# Patient Record
Sex: Female | Born: 2005 | Hispanic: No | Marital: Single | Smoking: Never smoker
Health system: Southern US, Community
[De-identification: ages and names within clinical notes are randomized; demographics above are authoritative.]

## PROBLEM LIST (undated history)

## (undated) DIAGNOSIS — K589 Irritable bowel syndrome without diarrhea: Secondary | ICD-10-CM

## (undated) DIAGNOSIS — M1990 Unspecified osteoarthritis, unspecified site: Secondary | ICD-10-CM

## (undated) DIAGNOSIS — F329 Major depressive disorder, single episode, unspecified: Secondary | ICD-10-CM

## (undated) DIAGNOSIS — K5090 Crohn's disease, unspecified, without complications: Secondary | ICD-10-CM

## (undated) DIAGNOSIS — F32A Depression, unspecified: Secondary | ICD-10-CM

## (undated) DIAGNOSIS — M069 Rheumatoid arthritis, unspecified: Secondary | ICD-10-CM

## (undated) DIAGNOSIS — H2000 Unspecified acute and subacute iridocyclitis: Secondary | ICD-10-CM

## (undated) HISTORY — PX: MYRINGOTOMY WITH TUBE PLACEMENT: SHX5663

## (undated) HISTORY — PX: WISDOM TOOTH EXTRACTION: SHX21

## (undated) HISTORY — PX: EXPLORATORY LAPAROTOMY: SUR591

## (undated) HISTORY — PX: TONSILLECTOMY: SUR1361

---
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
## 2024-01-02 NOTE — Telephone Encounter (Addendum)
 Reason for Contact: distended colon found incidentally during exploratory surgery  Assessment:  Cassie Morrow underwent exploratory surgery for endometriosis today.  [name] has endometriosis, but they were told that her colon was distended, even more distended than bladder, according to surgeon. [name] has had daily bowel movement with decent amount.  The consistency was loosely formed.  Due to endometriosis, she has had abdominal pain. GI-wise, she has been doing fine.  Request: Mom is asking your advice what this means and what to do.  Thanks. EJ

## 2024-01-02 NOTE — Interval H&P Note (Signed)
 History and Physical Update Attending Charleen BRAVO. [place], MD 01/02/2024  8:37 AM  I have reviewed the History and Physical (including allergies) and examined the patient. The following changes in the patient's condition were assessed: No change in patient condition.   History of Present Illness:  Cassie Morrow, 18yrs old, Female is here for   Procedures: LAPAROSCOPY, DIAGNOSTIC (N/A) EXCISION, ENDOMETRIOSIS, LAPAROSCOPIC (N/A).  Past Medical History[1] Past Surgical History[2]   Prior to Admission medications  Medication Sig Start Date End Date Taking? Authorizing Provider  acetaminophen  (TYLENOL ) 500 mg Oral Tablet Take 1 Tablet (500 mg total) by mouth every 4 hours as needed for Pain. 12/20/23   Haven, Charleen BRAVO., MD  bisacodyL  (DULCOLAX) 5 mg Oral Tablet, Delayed Release (E.C.) Take 1 Tablet (5 mg total) by mouth daily as needed for Constipation. 12/03/23   Sukka, Neelakanta Dinakar, MD  docusate sodium  (COLACE) 100 mg Oral Capsule Take 1 Capsule (100 mg total) by mouth in the morning and 1 Capsule (100 mg total) before bedtime. 12/03/23   Sukka, Neelakanta Dinakar, MD  elagolix [place]) 150 mg Oral Tablet Take by mouth in the morning. 12/20/23   Haven, Charleen BRAVO., MD  escitalopram  (LEXAPRO ) 20 mg Oral Tablet Take 1 Tablet (20 mg total) by mouth in the morning. 02/01/22   Reported, Patient  gabapentin (NEURONTIN) 300 mg Oral Capsule Take 1 Capsule (300 mg total) by mouth three times a day as needed for Pain. 12/20/23   Haven, Charleen BRAVO., MD  hydrOXYzine hcl (ATARAX) 25 mg Oral Tablet Take 1 Tablet (25 mg total) by mouth every 8 hours as needed. 04/08/21   Reported, Patient  ibuprofen  (ADVIL ,MOTRIN ) 800 mg Oral Tablet Take 1 Tablet (800 mg total) by mouth every 6 hours as needed for Pain. 12/20/23   Haven, Charleen BRAVO., MD  linaCLOtide  (Linzess ) 145 mcg Oral Capsule Take 1 Capsule (145 mcg total) by mouth in the morning. 08/02/22   Reported, Patient  melatonin 10 mg Oral Tablet Take 5 mg by mouth  every evening.    Reported, Patient  oxyCODONE  (ROXICODONE ) 10 mg Oral Tablet Take 1 Tablet (10 mg total) by mouth every 6 hours as needed. 12/03/23   Sukka, Neelakanta Dinakar, MD  pantoprazole  (PROTONIX ) 40 mg Oral Tablet, Delayed Release (E.C.) Take 40 mg (1 Tablet) by mouth in the morning. 10/02/23   Jones, Gilbert K, MD  rizatriptan (MAXALT) 10 mg Oral Tablet Take 1 Tablet (10 mg total) by mouth every 2 hours as needed. 07/17/21   Reported, Patient  scopolamine base (TRANSDERM-SCOP) 1 mg over 3 days Transdermal patch 3 day 1 mg by Transdermal route every 72 hours (3 days). 07/24/21   Reported, Patient  traZODone  (DESYREL ) 50 mg Oral Tablet Take 1.5 Tablets (75 mg total) by mouth at bedtime. 06/08/21   Reported, Patient  upadacitinib 30 mg Oral Tablet Sustained Release 24HR Take 30 mg by mouth in the morning. 07/25/23 07/24/24  Reported, Patient   Allergies[3]    Physical Exam:  No acute change from patient's baseline mental status noted.  No acute cardiopulmonary compromise noted.  Electronically signed by Charleen DRAFTS Haven, MD      [1] Past Medical History: Diagnosis Date   Acute anterior uveitis of both eyes    Anxiety    Arthritis    Crohn's colitis (CMS-HCC)    Depression    Ehlers-Danlos syndrome    Kidney calculi    Psoriasis   [2] Past Surgical History: Procedure Laterality Date   CHOLECYSTECTOMY, LAPAROSCOPIC N/A  10/09/2022   Performed by Athena Ozell SAUNDERS, MD at [facility] OR MAIN   SUR - COLONOSCOPY N/A    SUR - ENDOSCOPY N/A   [3] Allergies Allergen Reactions   Cefpodoxime Hives and Rash    Rash, Hives - can take Ceclor   Cipro [Ciprofloxacin Hcl] Hives   Ciprofloxacin Hives   Lactose Bleeding, Nausea and Vomiting, Rash and Unknown    Other Reaction(s): GI intolerance, GI Intolerance  Lactose intolerant   Ibuprofen  Unknown    Per patient not allergic  Other (See Comments) Patient has crohn's disease    Lactase     Other Reaction(s): vomiting

## 2024-01-02 NOTE — Progress Notes (Signed)
 ------------------------------------------------------------------------------- Attestation signed by Elyn Charleen BRAVO., MD at 01/02/24 1114 Attending Attestation    I saw and evaluated the patient on the day of service and agree with the medical student's documentation.   Charleen Elyn, M.D. Clinical Assistant Professor  Department of Obstetrics and Gynecology [facility]  -------------------------------------------------------------------------------  GYN Pre-Operative Note  MEDICAL STUDENT NOTE FOR LEARNING PURPOSES ONLY   Preop DX: Endometriosis  Procedure:  Diagnostic Laparoscopy with possible excision of endometriosis, hysteroscopy, cystoscopy  Feeling well today, no complaints. Denies N/V/D, fever, chills, SOB, CP.   Pertinent PMHhX/PSHx:  OB History  Gravida Para Term Preterm AB Living  0 0 0 0 0 0  SAB IAB Ectopic Molar Multiple Live Births  0 0 0 0 0 0    Past Medical History[1] Past Surgical History[2] Allergies[3]  Medications Ordered Prior to Encounter[4]  PE  There were no vitals taken for this visit. Cardio: RRR, no m/r/g  Resp: CTAB, no wheezes, rales, or rhonchi Extremities: warm, well-perfused. DP Pulses 2+ Abdominal: soft, NT, ND. +BS.  Labs/Studies   Last CBC results 07/18/2023: WBC - External 8.9 10*9/L; MCV - External 85.6 fL 10/02/2023: WBC, Urine 6 /HPF (H) 11/29/2023: WBC (White Blood Cell Count) 7.69 k/uL; Hemoglobin 11.1 g/dL (L); Hemoglobin, Urine Negative; Neutrophils (%) 59 %; MCV (Mean Corpuscular Volume) 95.3 fL; Platelet Count 348 k/uL 12/01/2023: Hemoglobin A1C 4.5 %  Last CBC results 07/18/2023: WBC - External 8.9 10*9/L; MCV - External 85.6 fL 10/02/2023: WBC, Urine 6 /HPF (H) 11/29/2023: WBC (White Blood Cell Count) 7.69 k/uL; Hemoglobin 11.1 g/dL (L); Hemoglobin, Urine Negative; Neutrophils (%) 59 %; MCV (Mean Corpuscular Volume) 95.3 fL; Platelet Count 348 k/uL 12/01/2023: Hemoglobin A1C 4.5 %  Recent Results (from the  past 4320 hours)  EKG 12 LEAD   Collection Time: 10/01/23 11:45 PM  Test Value Low-High   EKG Text      INTERFACED DOCUMENTS - [facility] - OTHERWISE NORMAL ECG - Sinus tachycardia rate>99 No previous ECG available for comparison Reading Physician     [id number]&Marcu&Constantin    Heart Rate 102    P-R Interval 162    P Axis 70    QRS Duration 80    QT Internal 329    QTcB 429    QRS Axis 26    I-40 Axis 46    T-40 Axis 9    T Wave Axis 29    ST Axis 62   EKGSCAN   Collection Time: 10/02/23  8:18 AM   Narrative   Ordered by an unspecified provider.    Consents for procedure and blood are signed and on the chart.   Cassie Morrow          [1] Past Medical History: Diagnosis Date   Acute anterior uveitis of both eyes    Anxiety    Arthritis    Crohn's colitis (CMS-HCC)    Depression    Ehlers-Danlos syndrome    Kidney calculi    Psoriasis   [2] Past Surgical History: Procedure Laterality Date   CHOLECYSTECTOMY, LAPAROSCOPIC N/A 10/09/2022   Performed by Athena Ozell SAUNDERS, MD at [facility] OR MAIN   SUR - COLONOSCOPY N/A    SUR - ENDOSCOPY N/A   [3] Allergies Allergen Reactions   Cefpodoxime Hives and Rash    Rash, Hives - can take Ceclor   Cipro [Ciprofloxacin Hcl] Hives   Ciprofloxacin Hives   Lactose Bleeding, Nausea and Vomiting, Rash and Unknown    Other Reaction(s):  GI intolerance, GI Intolerance  Lactose intolerant   Ibuprofen  Unknown    Per patient not allergic  Other (See Comments) Patient has crohn's disease    Lactase     Other Reaction(s): vomiting  [4] No current facility-administered medications on file prior to encounter.   Current Outpatient Medications on File Prior to Encounter  Medication Sig Dispense Refill   bisacodyL  (DULCOLAX) 5 mg Oral Tablet, Delayed Release (E.C.) Take 1 Tablet (5 mg total) by mouth daily as needed for Constipation. 30 Tablet 3   docusate sodium  (COLACE) 100 mg Oral Capsule Take 1  Capsule (100 mg total) by mouth in the morning and 1 Capsule (100 mg total) before bedtime. 60 Capsule 3   escitalopram  (LEXAPRO ) 20 mg Oral Tablet Take 1 Tablet (20 mg total) by mouth in the morning.     hydrOXYzine hcl (ATARAX) 25 mg Oral Tablet Take 1 Tablet (25 mg total) by mouth every 8 hours as needed.     linaCLOtide  (Linzess ) 145 mcg Oral Capsule Take 1 Capsule (145 mcg total) by mouth in the morning.     melatonin 10 mg Oral Tablet Take 5 mg by mouth every evening.     oxyCODONE  (ROXICODONE ) 10 mg Oral Tablet Take 1 Tablet (10 mg total) by mouth every 6 hours as needed. 20 Tablet 0   pantoprazole  (PROTONIX ) 40 mg Oral Tablet, Delayed Release (E.C.) Take 40 mg (1 Tablet) by mouth in the morning. 30 Tablet 1   rizatriptan (MAXALT) 10 mg Oral Tablet Take 1 Tablet (10 mg total) by mouth every 2 hours as needed.     scopolamine base (TRANSDERM-SCOP) 1 mg over 3 days Transdermal patch 3 day 1 mg by Transdermal route every 72 hours (3 days).     traZODone  (DESYREL ) 50 mg Oral Tablet Take 1.5 Tablets (75 mg total) by mouth at bedtime.     upadacitinib 30 mg Oral Tablet Sustained Release 24HR Take 30 mg by mouth in the morning.

## 2024-01-02 NOTE — Care Plan (Signed)
 Pt admited to N108 in NAD.  Up in chair in room, no adverse effects.  Tolerating soda and crackers PO. States pain 0/10, no PO PRN pain medication given.  Updated on POC and d/c criteria. Family called back to room.  Will continue to monitor.

## 2024-01-02 NOTE — Procedures (Addendum)
 I was present for this procedure and agree with the documentation by resident Dr. Lavanda Conger.   Charleen Shadow, M.D. Clinical Assistant Professor  Department of Obstetrics and Gynecology [facility]     Name:  Cassie Morrow DOB:  2005-10-17 MRN:  [id number] HAR:  [id number]    [facility] OPERATIVE NOTE   Date of Procedure: 01/02/2024  Indications for Procedure:  21yrs G0P0000 female with chronic pelvic pain and suspected endometriosis presenting for diagnostic laparoscopy, cystoscopy, and hysteroscopy   Pre Op Dx:   Chronic abdominal pain [R10.9, G89.29] Dysmenorrhea [N94.6] Post Op Dx:  Chronic abdominal pain [R10.9, G89.29] Dysmenorrhea [N94.6]  Procedure with Wound Class:   Procedures: LAPAROSCOPY, DIAGNOSTIC - Wound Class: Clean EXCISION, ENDOMETRIOSIS, LAPAROSCOPIC - Wound Class: Clean CYSTOSCOPY - Wound Class: Clean Contaminated HYSTEROSCOPY, DIAGNOSTIC - Wound Class: Clean Contaminated   Surgeon:   Primary: Haven, Charleen BRAVO., MD Resident - Assisting: Conger Lavanda, DO  OR Staff / Assistants:   Circulating Nurse: Rona Just, RN Relief Circulating Nurse: Mccoy Grout, RN Scrub: Lorriane Channel Relief Scrub: Prentiss Moats  Anesthesia:  General  Estimated Blood Loss: No values recorded during case  UOP: 200 mL IVF: 1000 mL  Specimen removed:    ID Type Source Tests Collected by Time Destination  1 : Peritoneal Biopsy - Endometriosis Tissue Peritoneal SURGICAL PATHOLOGIC EXAMINATION Seward, Charleen BRAVO., MD 01/02/2024 [phone]    Device/s Implanted:  * No implants in log *  Findings: Normal uterus, mildly polycystic appearing bilateral ovaries. Gunpowder endometriosis lesions visualized anterior to uterus, on right lateral sidewall, and in posterior cul-de-sac. Dilated descending colon with adhesions attaching colon to left pelvic side wall. Normal anatomy of bladder on cystoscopy with visualized bilateral ureters. Overall normal premenopausal  uterine cavity with one small polyp near right tubal ostia.  Complications:  None  Description of procedure: After informed consent the patient was taken to the operating room and placed in dorsal supine position where general endotracheal anesthesia was administered and found to be adequate. She was placed in dorsal lithotomy position with her arms tucked.  She was prepped and draped in the usual sterile fashion.  A Foley catheter placed.  A timeout was called and the procedure confirmed.  A speculum was placed allowing visualization of the cervix.  A Hulka tenaculum was placed.  The laparoscopic equipment was prepared.  The abdomen was entered through previous intraumbilical incision using a 5mm Excel trocar under direct visualization and a pneumoperitoneum was established.  Right and left lower quadrant ports were placed under visualization.  The entry point was visualized from an inferior port and atraumatic entry confirmed. The pelvis was visualized by freeing adhesions of the small and large intestine from the pelvic sidewalls. Ovaries were examined, no endometriomas visualized. Gunpowder lesion visualized and removed on anterior pelvic wall. Additional lesions were visualized and ablated in posterior cul-de-sac and right pelvic sidewall. Cystoscopy was then performed with normal anatomy.   Foley catheter was removed and cystoscopy was performed with normal findings noted above.  A speculum was placed and Hulka tenaculum was removed. The uterus was sounded to 7cm.  The hysteroscope was advanced and proliferative endometrium was noted with visualization of tubal ostia. The procedure was concluded. She was returned to dorsal supine position, awakened and extubated having appeared to tolerate the procedure well.  All counts were correct.  She was transferred to PACU in good condition.  Dr. Shadow attending physician, was present for the procedure.  Condition of Patient at the time  of transfer to next level:   Stable Planned Disposition following Recovery:  Home  Lavanda Conger, DO Date:  01/02/2024 Time:  10:51 AM

## 2024-01-02 NOTE — Care Plan (Signed)
 Patient has met discharge criteria.  Discharge instructions given to patient and mother.  Iv d/c'd.

## 2024-01-08 ENCOUNTER — Emergency Department (HOSPITAL_BASED_OUTPATIENT_CLINIC_OR_DEPARTMENT_OTHER)
Admission: EM | Admit: 2024-01-08 | Discharge: 2024-01-09 | Disposition: A | Source: Home / Self Care | Attending: Emergency Medicine | Admitting: Emergency Medicine

## 2024-01-08 ENCOUNTER — Encounter (HOSPITAL_BASED_OUTPATIENT_CLINIC_OR_DEPARTMENT_OTHER): Payer: Self-pay | Admitting: Emergency Medicine

## 2024-01-08 DIAGNOSIS — G8918 Other acute postprocedural pain: Secondary | ICD-10-CM | POA: Diagnosis not present

## 2024-01-08 DIAGNOSIS — R091 Pleurisy: Secondary | ICD-10-CM

## 2024-01-08 DIAGNOSIS — R109 Unspecified abdominal pain: Secondary | ICD-10-CM | POA: Diagnosis present

## 2024-01-08 MED ORDER — ONDANSETRON HCL 4 MG/2ML IJ SOLN
4.0000 mg | Freq: Once | INTRAMUSCULAR | Status: AC
  Administered 2024-01-08: 4 mg via INTRAVENOUS
  Filled 2024-01-08: qty 2

## 2024-01-08 MED ORDER — FENTANYL CITRATE (PF) 50 MCG/ML IJ SOSY
50.0000 ug | PREFILLED_SYRINGE | Freq: Once | INTRAMUSCULAR | Status: AC
  Administered 2024-01-08: 50 ug via INTRAVENOUS
  Filled 2024-01-08: qty 1

## 2024-01-08 NOTE — ED Triage Notes (Signed)
 Pt reports pain at incision site, RUQ and lower back today; pain has increased throughout the day and is not controlled by rx or OTC meds; s/p exp lap 12/8

## 2024-01-08 NOTE — ED Provider Notes (Signed)
 [place] [facility] Provider Note   CSN: [id number] Arrival date & time: 01/08/24  2245     Patient presents with: Post-op Problem   Cassie Morrow is a 18 y.o. female.  {Add pertinent medical, surgical, social history, OB history to YEP:67052} The history is provided by the patient and a parent.  Patient with history of chronic abdominal pain, Crohn's disease, EDS, irritable bowel syndrome, chronic joint pain presents with postoperative abdominal pain Patient underwent diagnostic laparoscopy for endometriosis around December 8, performed at [facility].  Patient reports that initially had good pain relief, but over the past 24 hours she has had increasing generalized abdominal pain and nausea without vomiting.  Denies any diarrhea, reports normal bowel movements without any blood or melena.  No vaginal bleeding. She also reports 2 nights ago she had severe pleuritic chest pain and since that time has been feeling short of breath. No previous history of CAD/VTE.  Father reports they called her surgeon in [place] who advised ER evaluation Patient takes Rinvoq Prior to Admission medications  Medication Sig Start Date End Date Taking? Authorizing Provider  Alpha-D-Galactosidase (BEANO) TABS Take 1 tablet by mouth daily at 6 PM.    [provider]  calcium  carbonate (OS-CAL) 1250 (500 Ca) MG chewable tablet Chew 1 tablet by mouth daily.    [provider]  cholecalciferol  (VITAMIN D3) 25 MCG (1000 UNIT) tablet Take 1,000 Units by mouth daily.    [provider]  clotrimazole -betamethasone  (LOTRISONE ) cream APPLY A SMALL AMOUNT TO THE AFFECTED AREA(S) ONCE DAILY AS DIRECTED 06/07/22   Antonio Meth, Yvonne R, DO  docusate sodium  (COLACE) 100 MG capsule Take 100 mg by mouth every other day. Alternates with exlax    [provider]  escitalopram  (LEXAPRO ) 20 MG tablet TAKE 1 TABLET BY MOUTH DAILY MUST SCHEDULE AN  APPOINMENT FOR FURTHER REFILLS 09/27/23   Antonio Meth, Jamee SAUNDERS, DO  fluticasone (FLONASE) 50 MCG/ACT nasal spray Place 2 sprays into both nostrils daily. 04/30/16   [provider]  granisetron  (KYTRIL ) 1 MG tablet Take 1 mg by mouth 2 (two) times daily as needed for nausea.     [provider]  inFLIXimab  in sodium chloride  0.9 % Inject 100 mg into the vein once. Pt takes every 4 weeks and next dose is 02/22/19 02/22/19   [provider]  inFLIXimab -dyyb (INFLECTRA ) 100 MG SOLR Inject 100 mg into the vein every 6 (six) weeks. 06/07/22   Lowne Chase, Yvonne R, DO  Lactobacillus Rhamnosus, GG, (RA PROBIOTIC DIGESTIVE CARE) CAPS Take 1 tablet by mouth daily.    [provider]  linaclotide  (LINZESS ) 145 MCG CAPS capsule Take 145 mcg by mouth daily before breakfast.    [provider]  Melatonin 10 MG TABS Take by mouth at bedtime.     [provider]  Omega-3 1000 MG CAPS Take 1 capsule by mouth daily.    [provider]  pantoprazole  (PROTONIX ) 40 MG tablet Take 40 mg by mouth 2 (two) times daily.     [provider]  Probiotic Product (SUPER PROBIOTIC) CAPS Take 1 capsule by mouth daily. 11/08/16   [provider]  Sennosides 25 MG TABS Take 1 tablet by mouth daily.     [provider]  Simethicone  125 MG CAPS Take 1 capsule by mouth 3 (three) times daily.    [provider]  traZODone  (DESYREL ) 50 MG tablet Take 75 mg by mouth at  bedtime.     [provider]    Allergies: Cefpodoxime proxetil, Ciprofloxacin, Lactose intolerance (gi), and Motrin  [ibuprofen ]    Review of Systems  Constitutional:  Negative for fever.  Respiratory:  Positive for shortness of breath.   Cardiovascular:  Positive for chest pain.  Gastrointestinal:  Positive for abdominal pain and nausea. Negative for blood in stool, constipation, diarrhea and vomiting.  Genitourinary:  Negative for dysuria and vaginal bleeding.     Updated Vital Signs BP (!) 143/73   Pulse (!) 101   Temp 98.3 F (36.8 C) (Oral)   Ht 1.524 m (5')   Wt 86.2 kg   SpO2 100%   BMI 37.11 kg/m   Physical Exam CONSTITUTIONAL: Well developed/well nourished HEAD: Normocephalic/atraumatic EYES: EOMI/PERRL, no icterus ENMT: Mucous membranes moist NECK: supple no meningeal signs SPINE/BACK:entire spine nontender CV: S1/S2 noted, tachycardic LUNGS: Lungs are clear to auscultation bilaterally, no apparent distress ABDOMEN: soft, moderate diffuse tenderness, no rebound or guarding, bowel sounds noted throughout abdomen Well-healed incisions No hernias noted GU:no cva tenderness NEURO: Pt is awake/alert/appropriate, moves all extremitiesx4.  No facial droop.   EXTREMITIES: pulses normal/equal, full ROM SKIN: warm, color normal  (all labs ordered are listed, but only abnormal results are displayed) Labs Reviewed  CBC WITH DIFFERENTIAL/PLATELET  COMPREHENSIVE METABOLIC PANEL WITH GFR  LIPASE, BLOOD  HCG, SERUM, QUALITATIVE  D-DIMER, QUANTITATIVE  URINALYSIS, ROUTINE W REFLEX MICROSCOPIC    EKG: EKG Interpretation Date/Time:  Sunday January 08 2024 22:58:57 EST Ventricular Rate:  102 PR Interval:  143 QRS Duration:  83 QT Interval:  337 QTC Calculation: 439 R Axis:   50  Text Interpretation: Sinus tachycardia Confirmed by Midge Golas ([id number]) on 01/08/2024 11:08:30 PM  Radiology: No results found.  {Document cardiac monitor, telemetry assessment procedure when appropriate:32947} Procedures   Medications Ordered in the ED  fentaNYL  (SUBLIMAZE ) injection 50 mcg (has no administration in time range)  ondansetron  (ZOFRAN ) injection 4 mg (has no administration in time range)      {Click here for ABCD2, HEART and other calculators REFRESH Note before signing:1}                              Medical Decision Making Amount and/or Complexity of Data Reviewed Labs: ordered.  Risk Prescription drug  management.   This patient presents to the ED for concern of abdominal pain, this involves an extensive number of treatment options, and is a complaint that carries with it a high risk of complications and morbidity.  The differential diagnosis includes but is not limited to pancreatitis, gastritis, peptic ulcer disease, appendicitis, bowel obstruction, bowel perforation, diverticulitis, ischemic bowel, ectopic pregnancy, PID, tubo-ovarian abscess, ovarian torsion   Comorbidities that complicate the patient evaluation: Patients presentation is complicated by their history of Crohn's disease  Social Determinants of Health: Patients multiple ER evaluations  increases the complexity of managing their presentation  Additional history obtained: Additional history obtained from parents Records reviewed Care Everywhere/External Records  Lab Tests: I Ordered, and personally interpreted labs.  The pertinent results include:  ***  Imaging Studies ordered: I ordered imaging studies including {imaging:26848}  I independently visualized and interpreted imaging which showed *** I agree with the radiologist interpretation  Cardiac Monitoring: The patient was maintained on a cardiac monitor.  I personally viewed and interpreted the cardiac monitor which showed an underlying rhythm of:  {cardiac monitor:26849}  Medicines ordered and prescription drug management: I ordered medication  including fentanyl  for pain Reevaluation of the patient after these medicines showed that the patient    {resolved/improved/worsened:23923::improved}  Test Considered: Patient is low risk / negative by ***, therefore do not feel that *** is indicated.  Critical Interventions:  ***  Consultations Obtained: I requested consultation with the {consultation:26851}, and discussed  findings as well as pertinent plan - they recommend: ***  Reevaluation: After the interventions noted above, I reevaluated the patient  and found that they have :{resolved/improved/worsened:23923::improved}  Complexity of problems addressed: Patients presentation is most consistent with  acute presentation with potential threat to life or bodily function  Disposition: After consideration of the diagnostic results and the patients response to treatment,  I feel that the patent would benefit from {disposition:26850}.     {Document critical care time when appropriate  Document review of labs and clinical decision tools ie CHADS2VASC2, etc  Document your independent review of radiology images and any outside records  Document your discussion with family members, caretakers and with consultants  Document social determinants of health affecting pt's care  Document your decision making why or why not admission, treatments were needed:32947:::1}   Final diagnoses:  None    ED Discharge Orders     None

## 2024-01-09 ENCOUNTER — Encounter (HOSPITAL_BASED_OUTPATIENT_CLINIC_OR_DEPARTMENT_OTHER): Payer: Self-pay

## 2024-01-09 ENCOUNTER — Emergency Department (HOSPITAL_BASED_OUTPATIENT_CLINIC_OR_DEPARTMENT_OTHER)

## 2024-01-09 LAB — CBC WITH DIFFERENTIAL/PLATELET
Abs Immature Granulocytes: 0.05 K/uL (ref 0.00–0.07)
Basophils Absolute: 0.1 K/uL (ref 0.0–0.1)
Basophils Relative: 1 %
Eosinophils Absolute: 0.3 K/uL (ref 0.0–0.5)
Eosinophils Relative: 3 %
HCT: 34.1 % — ABNORMAL LOW (ref 36.0–46.0)
Hemoglobin: 11.5 g/dL — ABNORMAL LOW (ref 12.0–15.0)
Immature Granulocytes: 1 %
Lymphocytes Relative: 44 %
Lymphs Abs: 3.8 K/uL (ref 0.7–4.0)
MCH: 31.3 pg (ref 26.0–34.0)
MCHC: 33.7 g/dL (ref 30.0–36.0)
MCV: 92.9 fL (ref 80.0–100.0)
Monocytes Absolute: 0.7 K/uL (ref 0.1–1.0)
Monocytes Relative: 7 %
Neutro Abs: 3.9 K/uL (ref 1.7–7.7)
Neutrophils Relative %: 44 %
Platelets: 361 K/uL (ref 150–400)
RBC: 3.67 MIL/uL — ABNORMAL LOW (ref 3.87–5.11)
RDW: 12.4 % (ref 11.5–15.5)
WBC: 8.7 K/uL (ref 4.0–10.5)
nRBC: 0 % (ref 0.0–0.2)

## 2024-01-09 LAB — COMPREHENSIVE METABOLIC PANEL WITH GFR
ALT: 18 U/L (ref 0–44)
AST: 19 U/L (ref 15–41)
Albumin: 3.8 g/dL (ref 3.5–5.0)
Alkaline Phosphatase: 52 U/L (ref 38–126)
Anion gap: 11 (ref 5–15)
BUN: 12 mg/dL (ref 6–20)
CO2: 23 mmol/L (ref 22–32)
Calcium: 9 mg/dL (ref 8.9–10.3)
Chloride: 104 mmol/L (ref 98–111)
Creatinine, Ser: 0.71 mg/dL (ref 0.44–1.00)
GFR, Estimated: >60 mL/min (ref >60)
Glucose, Bld: 127 mg/dL — ABNORMAL HIGH (ref 70–99)
Potassium: 3.7 mmol/L (ref 3.5–5.1)
Sodium: 138 mmol/L (ref 135–145)
Total Bilirubin: <0.2 mg/dL (ref 0.0–1.2)
Total Protein: 6.6 g/dL (ref 6.5–8.1)

## 2024-01-09 LAB — LIPASE, BLOOD: Lipase: 41 U/L (ref 11–51)

## 2024-01-09 LAB — HCG, SERUM, QUALITATIVE: Preg, Serum: NEGATIVE

## 2024-01-09 LAB — D-DIMER, QUANTITATIVE: D-Dimer, Quant: 1.08 ug{FEU}/mL — ABNORMAL HIGH (ref 0.00–0.50)

## 2024-01-09 MED ORDER — HYDROMORPHONE HCL 1 MG/ML IJ SOLN
0.5000 mg | Freq: Once | INTRAMUSCULAR | Status: AC
  Administered 2024-01-09: 01:00:00 0.5 mg via INTRAVENOUS
  Filled 2024-01-09: qty 1

## 2024-01-09 MED ORDER — IOHEXOL 350 MG/ML SOLN
100.0000 mL | Freq: Once | INTRAVENOUS | Status: AC | PRN
  Administered 2024-01-09: 01:00:00 100 mL via INTRAVENOUS

## 2024-01-09 NOTE — ED Notes (Signed)
 Discharge paperwork reviewed entirely with patient, including follow up care. Pain was under control. No prescriptions were called in, but all questions were addressed.  Pt verbalized understanding as well as all parties involved. No questions or concerns voiced at the time of discharge. No acute distress noted. Pt was encouraged to stay adequately hydrated and eat a healthy diet.   Pt ambulated out to PVA without incident or assistance.  Pt advised they will notify their PCP immediately. and Pt advised they will seek followup care with a specialist and followup with their PCP.   The pt was instructed to set up and/or review MyChart for their results; and was informed their Providers all have access to the information as well.

## 2024-01-09 NOTE — Discharge Instructions (Signed)
 Please call your surgeon for follow-up care.  SEEK IMMEDIATE MEDICAL ATTENTION IF: The pain does not go away or becomes severe, particularly over the next 8-12 hours.  A temperature above 100.47F develops.  Repeated vomiting occurs (multiple episodes).  The pain becomes localized to portions of the abdomen.  Blood is being passed in stools or vomit (bright red or black tarry stools).  Return also if you develop chest pain, difficulty breathing, dizziness or fainting, or become confused, poorly responsive, or inconsolable.

## 2024-01-25 ENCOUNTER — Emergency Department (HOSPITAL_BASED_OUTPATIENT_CLINIC_OR_DEPARTMENT_OTHER)

## 2024-01-25 ENCOUNTER — Other Ambulatory Visit: Payer: Self-pay

## 2024-01-25 ENCOUNTER — Emergency Department (HOSPITAL_BASED_OUTPATIENT_CLINIC_OR_DEPARTMENT_OTHER)
Admission: EM | Admit: 2024-01-25 | Discharge: 2024-01-25 | Disposition: A | Discharge status: Home / Self Care | Attending: Emergency Medicine | Admitting: Emergency Medicine

## 2024-01-25 DIAGNOSIS — X501XXA Overexertion from prolonged static or awkward postures, initial encounter: Secondary | ICD-10-CM | POA: Insufficient documentation

## 2024-01-25 DIAGNOSIS — S93492A Sprain of other ligament of left ankle, initial encounter: Secondary | ICD-10-CM | POA: Insufficient documentation

## 2024-01-25 DIAGNOSIS — M25572 Pain in left ankle and joints of left foot: Secondary | ICD-10-CM | POA: Diagnosis present

## 2024-01-25 NOTE — Discharge Instructions (Addendum)
 Your x-ray today is negative.  This is likely a sprain.  Like we discussed, weight-bear as tolerated.  I recommend icing the area few times per day.  Take Motrin  and Tylenol  at home.  If you are still having pain where he feels it is not getting better after 1 week, I have included the telephone number for an orthopedic surgeon.  Follow-up with your PCP as needed.

## 2024-01-25 NOTE — ED Triage Notes (Addendum)
 Pt arrives with complaint of left ankle pain. States that she fell down the steps. States that she fell down the steps yesterday. Ankle is slightly swollen. Hurts to put pressure on foot

## 2024-01-25 NOTE — ED Provider Notes (Signed)
 " [place] [facility] Provider Note  CSN: [id number] Arrival date & time: 01/25/24 1404  Chief Complaint(s) Ankle Pain  HPI Cassie Morrow is a 18 y.o. female who is here today with left ankle pain.  Patient reports that she was walking on the stairs, fell down the steps and rolled her ankle.  This occurred yesterday.  She has been ambulatory but it has been painful.   Past Medical History Past Medical History:  Diagnosis Date   Acute anterior uveitis of both eyes    Arthritis    Crohn's disease (HCC)    Depression    IBS (irritable bowel syndrome)    RA (rheumatoid arthritis) (HCC)    Patient Active Problem List   Diagnosis Date Noted   Tinea 07/10/2021   RA (rheumatoid arthritis) (HCC)    Crohn's disease (HCC) 08/05/2020   IBS (irritable bowel syndrome) 08/05/2020   Suicide attempt by drug ingestion (HCC) 02/22/2019   MDD (major depressive disorder), severe (HCC) 02/21/2019   Home Medication(s) Prior to Admission medications  Medication Sig Start Date End Date Taking? Authorizing Provider  gabapentin (NEURONTIN) 300 MG capsule Take 300 mg by mouth 3 (three) times daily. Take 1 Capsule (300 mg total) by mouth three times a day as needed for Pain. 12/20/23  Yes [provider]  ibuprofen  (ADVIL ) 800 MG tablet Take 800 mg by mouth every 6 (six) hours as needed. 01/15/24  Yes [provider]  Mefenamic Acid 250 MG CAPS Take 250 mg by mouth every 6 (six) hours as needed (pain). 11/15/19  Yes [provider]  ORILISSA 150 MG TABS Take by mouth. 12/26/23  Yes [provider]  ORILISSA 200 MG TABS Take 200 mg by mouth 2 (two) times daily. Take 200 mg by mouth in the morning and 200 mg before bedtime. 01/17/24  Yes [provider]  oxyCODONE  (OXY IR/ROXICODONE ) 5 MG immediate release tablet Take 5 mg by mouth every 12 (twelve) hours as needed for breakthrough pain. 01/17/24  Yes [provider]   Upadacitinib ER 30 MG TB24 Take 30 mg by mouth every morning. 07/25/23 07/24/24 Yes [provider]  Alpha-D-Galactosidase THAD) TABS Take 1 tablet by mouth daily at 6 PM.    [provider]  calcium  carbonate (OS-CAL) 1250 (500 Ca) MG chewable tablet Chew 1 tablet by mouth daily.    [provider]  cholecalciferol  (VITAMIN D3) 25 MCG (1000 UNIT) tablet Take 1,000 Units by mouth daily.    [provider]  clotrimazole -betamethasone  (LOTRISONE ) cream APPLY A SMALL AMOUNT TO THE AFFECTED AREA(S) ONCE DAILY AS DIRECTED 06/07/22   Antonio Meth, Yvonne R, DO  docusate sodium  (COLACE) 100 MG capsule Take 100 mg by mouth every other day. Alternates with exlax    [provider]  escitalopram  (LEXAPRO ) 20 MG tablet TAKE 1 TABLET BY MOUTH DAILY MUST SCHEDULE AN APPOINMENT FOR FURTHER REFILLS 09/27/23   Antonio Meth, Jamee SAUNDERS, DO  fluticasone (FLONASE) 50 MCG/ACT nasal spray Place 2 sprays into both nostrils daily. 04/30/16   [provider]  granisetron  (KYTRIL ) 1 MG tablet Take 1 mg by mouth 2 (two) times daily as needed for nausea.     [provider]  HAILEY FE 1/20 1-20 MG-MCG tablet Take 1 tablet by mouth daily.    [provider]  inFLIXimab  in sodium chloride  0.9 % Inject 100 mg into the vein once. Pt takes every 4 weeks and next dose is 02/22/19 02/22/19  [provider]  inFLIXimab -dyyb (INFLECTRA ) 100 MG SOLR Inject 100 mg into the vein every 6 (six) weeks. 06/07/22   Lowne Chase, Yvonne R, DO  Lactobacillus Rhamnosus, GG, (RA PROBIOTIC DIGESTIVE CARE) CAPS Take 1 tablet by mouth daily.    [provider]  linaclotide  (LINZESS ) 145 MCG CAPS capsule Take 145 mcg by mouth daily before breakfast.    [provider]  Melatonin 10 MG TABS Take by mouth at bedtime.     [provider]  Omega-3 1000 MG CAPS Take 1 capsule by mouth daily.    [provider]  pantoprazole  (PROTONIX ) 40 MG tablet  Take 40 mg by mouth 2 (two) times daily.     [provider]  Probiotic Product (SUPER PROBIOTIC) CAPS Take 1 capsule by mouth daily. 11/08/16   [provider]  Sennosides 25 MG TABS Take 1 tablet by mouth daily.     [provider]  Simethicone  125 MG CAPS Take 1 capsule by mouth 3 (three) times daily.    [provider]  traZODone  (DESYREL ) 50 MG tablet Take 75 mg by mouth at bedtime.     [provider]                                                                                                                                    Past Surgical History Past Surgical History:  Procedure Laterality Date   EXPLORATORY LAPAROTOMY     MYRINGOTOMY WITH TUBE PLACEMENT     TONSILLECTOMY     WISDOM TOOTH EXTRACTION     Family History Family History  Problem Relation Age of Onset   Hypertension Mother    Thyroid  cancer Mother    Diabetes Father    Hyperlipidemia Father    Hypertension Father    Prostate cancer Maternal Grandfather    Hyperlipidemia Paternal Grandmother    Hyperlipidemia Paternal Grandfather    Diabetes Maternal Great-grandfather     Social History Social History[1] Allergies Cefpodoxime proxetil, Ciprofloxacin, Lactose intolerance (gi), and Motrin  [ibuprofen ]  Review of Systems Review of Systems  Physical Exam Vital Signs  I have reviewed the triage vital signs BP 123/62 (BP Location: Right Arm)   Pulse (!) 102   Temp 97.7 F (36.5 C) (Oral)   Resp 16   Ht 5' (1.524 m)   Wt 86.2 kg   SpO2 99%   BMI 37.11 kg/m   Physical Exam Vitals and nursing note reviewed.  Constitutional:      Appearance: Normal appearance.  Pulmonary:     Effort: Pulmonary effort is normal.  Musculoskeletal:     Comments: Mild erythema over the lateral malleolus.  Point tenderness over the lateral malleolus.  No tenderness over the fifth metatarsal.  No open fracture, no significant bruising.  Skin:    General: Skin is warm.   Neurological:     General: No focal deficit present.  Mental Status: She is alert.     ED Results and Treatments Labs (all labs ordered are listed, but only abnormal results are displayed) Labs Reviewed - No data to display                                                                                                                        Radiology DG Foot Complete Left Result Date: 01/25/2024 EXAM: 3 OR MORE VIEW(S) XRAY OF THE LEFT FOOT 01/25/2024 02:35:00 PM COMPARISON: None available. CLINICAL HISTORY: [id number] Injury [id number] FINDINGS: BONES AND JOINTS: No acute fracture. No malalignment. SOFT TISSUES: Mild diffuse soft tissue edema. IMPRESSION: 1. No acute fracture or dislocation. 2. Mild diffuse soft tissue edema. Electronically signed by: Waddell Calk MD 01/25/2024 03:10 PM EST RP Workstation: [id number]   DG Ankle Complete Left Result Date: 01/25/2024 EXAM: 3 OR MORE VIEW(S) XRAY OF THE LEFT ANKLE 01/25/2024 02:35:00 PM CLINICAL HISTORY: injury COMPARISON: None available. FINDINGS: BONES AND JOINTS: No acute fracture. No malalignment. SOFT TISSUES: Mild diffuse soft tissue swelling. IMPRESSION: 1. No acute fracture or dislocation. 2. Mild diffuse soft tissue swelling. Electronically signed by: Waddell Calk MD 01/25/2024 03:05 PM EST RP Workstation: [id number]    Pertinent labs & imaging results that were available during my care of the patient were reviewed by me and considered in my medical decision making (see MDM for details).  Medications Ordered in ED Medications - No data to display                                                                                                                                   Procedures Procedures  (including critical care time)  Medical Decision Making / ED Course   This patient presents to the ED for concern of ankle pain, this involves an extensive number of treatment options, and is a complaint that carries with it a high  risk of complications and morbidity.  The differential diagnosis includes ankle fracture, ankle sprain.  MDM: Patient with a relatively benign exam.  Exam favors sprain, no fracture identified on x-ray.  Counseled on RICE, weightbearing as tolerated.  Will provide her with some crutches.  Instructed follow-up with orthopedics if symptoms not improved in 1 week.   Additional history obtained: -Additional history obtained from father at bedside -External records from outside source obtained and reviewed including: Chart review including previous notes, labs, imaging, consultation notes  Lab Tests: -I ordered, reviewed, and interpreted labs.   The pertinent results include:   Labs Reviewed - No data to display    Imaging Studies ordered: I ordered imaging studies including x-ray ankle x-ray foot I independently visualized and interpreted imaging. I agree with the radiologist interpretation   Medicines ordered and prescription drug management: No orders of the defined types were placed in this encounter.   -I have reviewed the patients home medicines and have made adjustments as needed   Cardiac Monitoring: The patient was maintained on a cardiac monitor.  I personally viewed and interpreted the cardiac monitored which showed an underlying rhythm of: Normal sinus rhythm  Social Determinants of Health:  Factors impacting patients care include: Lack of access to primary care   Reevaluation: After the interventions noted above, I reevaluated the patient and found that they have :improved  Co morbidities that complicate the patient evaluation  Past Medical History:  Diagnosis Date   Acute anterior uveitis of both eyes    Arthritis    Crohn's disease (HCC)    Depression    IBS (irritable bowel syndrome)    RA (rheumatoid arthritis) (HCC)        Final Clinical Impression(s) / ED Diagnoses Final diagnoses:  Sprain of anterior talofibular ligament of left ankle, initial  encounter     @PCDICTATION @     [1]  Social History Tobacco Use   Smoking status: Never   Smokeless tobacco: Never  Vaping Use   Vaping status: Never Used  Substance Use Topics   Alcohol use: Never   Drug use: Never     Mannie Pac T, DO 01/25/24 1602  "

## 2024-01-25 NOTE — ED Notes (Signed)
 Pt was able to demonstrate proper use of crutches. SABRA.The patient is A&OX4, Pt verbalized understanding of d/c instructions and follow up care.

## 2024-02-04 ENCOUNTER — Emergency Department (HOSPITAL_BASED_OUTPATIENT_CLINIC_OR_DEPARTMENT_OTHER)
Admission: EM | Admit: 2024-02-04 | Discharge: 2024-02-05 | Disposition: A | Attending: Emergency Medicine | Admitting: Emergency Medicine

## 2024-02-04 DIAGNOSIS — R102 Pelvic and perineal pain unspecified side: Secondary | ICD-10-CM | POA: Insufficient documentation

## 2024-02-04 DIAGNOSIS — Z8742 Personal history of other diseases of the female genital tract: Secondary | ICD-10-CM | POA: Diagnosis not present

## 2024-02-04 LAB — CBC WITH DIFFERENTIAL/PLATELET
Abs Immature Granulocytes: 0.03 K/uL (ref 0.00–0.07)
Basophils Absolute: 0 K/uL (ref 0.0–0.1)
Basophils Relative: 0 %
Eosinophils Absolute: 0.2 K/uL (ref 0.0–0.5)
Eosinophils Relative: 2 %
HCT: 38 % (ref 36.0–46.0)
Hemoglobin: 12.8 g/dL (ref 12.0–15.0)
Immature Granulocytes: 0 %
Lymphocytes Relative: 31 %
Lymphs Abs: 2.8 K/uL (ref 0.7–4.0)
MCH: 30.8 pg (ref 26.0–34.0)
MCHC: 33.7 g/dL (ref 30.0–36.0)
MCV: 91.6 fL (ref 80.0–100.0)
Monocytes Absolute: 0.6 K/uL (ref 0.1–1.0)
Monocytes Relative: 7 %
Neutro Abs: 5.3 K/uL (ref 1.7–7.7)
Neutrophils Relative %: 60 %
Platelets: 339 K/uL (ref 150–400)
RBC: 4.15 MIL/uL (ref 3.87–5.11)
RDW: 12.6 % (ref 11.5–15.5)
WBC: 9 K/uL (ref 4.0–10.5)
nRBC: 0 % (ref 0.0–0.2)

## 2024-02-04 LAB — COMPREHENSIVE METABOLIC PANEL WITH GFR
ALT: 16 U/L (ref 0–44)
AST: 22 U/L (ref 15–41)
Albumin: 4.3 g/dL (ref 3.5–5.0)
Alkaline Phosphatase: 49 U/L (ref 38–126)
Anion gap: 13 (ref 5–15)
BUN: 14 mg/dL (ref 6–20)
CO2: 21 mmol/L — ABNORMAL LOW (ref 22–32)
Calcium: 9.1 mg/dL (ref 8.9–10.3)
Chloride: 105 mmol/L (ref 98–111)
Creatinine, Ser: 0.9 mg/dL (ref 0.44–1.00)
GFR, Estimated: >60 mL/min
Glucose, Bld: 93 mg/dL (ref 70–99)
Potassium: 3.8 mmol/L (ref 3.5–5.1)
Sodium: 138 mmol/L (ref 135–145)
Total Bilirubin: <0.2 mg/dL (ref 0.0–1.2)
Total Protein: 7 g/dL (ref 6.5–8.1)

## 2024-02-04 LAB — HCG, SERUM, QUALITATIVE: Preg, Serum: NEGATIVE

## 2024-02-04 MED ORDER — HYDROMORPHONE HCL 1 MG/ML IJ SOLN
1.0000 mg | Freq: Once | INTRAMUSCULAR | Status: AC
  Administered 2024-02-04: 1 mg via INTRAVENOUS
  Filled 2024-02-04: qty 1

## 2024-02-04 MED ORDER — ONDANSETRON HCL 4 MG/2ML IJ SOLN
4.0000 mg | Freq: Once | INTRAMUSCULAR | Status: AC
  Administered 2024-02-04: 4 mg via INTRAVENOUS
  Filled 2024-02-04: qty 2

## 2024-02-04 MED ORDER — KETOROLAC TROMETHAMINE 30 MG/ML IJ SOLN
30.0000 mg | Freq: Once | INTRAMUSCULAR | Status: AC
  Administered 2024-02-04: 30 mg via INTRAVENOUS
  Filled 2024-02-04: qty 1

## 2024-02-04 NOTE — ED Triage Notes (Signed)
 Hx of endometriosis but 1 week has been in pain.  Taking Oxy but it's not working very much anymore.  Took oxy 10 mg 1 hour PTA.  Dec 8 had exploratory surgery for same.

## 2024-02-04 NOTE — ED Provider Notes (Signed)
 " [facility] Provider Note   CSN: [id number] Arrival date & time: 02/04/24  2215     Patient presents with: Pelvic Pain   Cassie Morrow is a 19 y.o. female.  {Add pertinent medical, surgical, social history, OB history to HPI:32947} Patient is a 19 year old female with history of endometriosis diagnosed by laparoscopy in [place].  Patient presenting with complaints of a flareup of this.  She states she is having pain across her lower abdomen.  Pain feels similar to what she has experienced with prior flareups.  That she is not getting much relief at home with oxycodone .  No fevers or chills.  No bowel or bladder complaints.       Prior to Admission medications  Medication Sig Start Date End Date Taking? Authorizing Provider  Alpha-D-Galactosidase (BEANO) TABS Take 1 tablet by mouth daily at 6 PM.    [provider]  calcium  carbonate (OS-CAL) 1250 (500 Ca) MG chewable tablet Chew 1 tablet by mouth daily.    [provider]  cholecalciferol  (VITAMIN D3) 25 MCG (1000 UNIT) tablet Take 1,000 Units by mouth daily.    [provider]  clotrimazole -betamethasone  (LOTRISONE ) cream APPLY A SMALL AMOUNT TO THE AFFECTED AREA(S) ONCE DAILY AS DIRECTED 06/07/22   Antonio Meth, Yvonne R, DO  docusate sodium  (COLACE) 100 MG capsule Take 100 mg by mouth every other day. Alternates with exlax    [provider]  escitalopram  (LEXAPRO ) 20 MG tablet TAKE 1 TABLET BY MOUTH DAILY MUST SCHEDULE AN APPOINMENT FOR FURTHER REFILLS 09/27/23   Antonio Meth, Jamee SAUNDERS, DO  fluticasone (FLONASE) 50 MCG/ACT nasal spray Place 2 sprays into both nostrils daily. 04/30/16   [provider]  gabapentin (NEURONTIN) 300 MG capsule Take 300 mg by mouth 3 (three) times daily. Take 1 Capsule (300 mg total) by mouth three times a day as needed for Pain. 12/20/23   [provider]  granisetron  (KYTRIL ) 1 MG tablet Take 1 mg by mouth 2  (two) times daily as needed for nausea.     [provider]  HAILEY FE 1/20 1-20 MG-MCG tablet Take 1 tablet by mouth daily.    [provider]  ibuprofen  (ADVIL ) 800 MG tablet Take 800 mg by mouth every 6 (six) hours as needed. 01/15/24   [provider]  inFLIXimab  in sodium chloride  0.9 % Inject 100 mg into the vein once. Pt takes every 4 weeks and next dose is 02/22/19 02/22/19   [provider]  inFLIXimab -dyyb (INFLECTRA ) 100 MG SOLR Inject 100 mg into the vein every 6 (six) weeks. 06/07/22   Lowne Chase, Yvonne R, DO  Lactobacillus Rhamnosus, GG, (RA PROBIOTIC DIGESTIVE CARE) CAPS Take 1 tablet by mouth daily.    [provider]  linaclotide  (LINZESS ) 145 MCG CAPS capsule Take 145 mcg by mouth daily before breakfast.    [provider]  Mefenamic Acid 250 MG CAPS Take 250 mg by mouth every 6 (six) hours as needed (pain). 11/15/19   [provider]  Melatonin 10 MG TABS Take by mouth at bedtime.     [provider]  Omega-3 1000 MG CAPS Take 1 capsule by mouth daily.    [provider]  ORILISSA 150 MG TABS Take by mouth. 12/26/23   [provider]  ORILISSA 200 MG TABS Take 200 mg by mouth 2 (two) times daily. Take 200 mg by mouth in the morning and 200 mg before bedtime. 01/17/24  [provider]  oxyCODONE  (OXY IR/ROXICODONE ) 5 MG immediate release tablet Take 5 mg by mouth every 12 (twelve) hours as needed for breakthrough pain. 01/17/24   [provider]  pantoprazole  (PROTONIX ) 40 MG tablet Take 40 mg by mouth 2 (two) times daily.     [provider]  Probiotic Product (SUPER PROBIOTIC) CAPS Take 1 capsule by mouth daily. 11/08/16   [provider]  Sennosides 25 MG TABS Take 1 tablet by mouth daily.     [provider]  Simethicone  125 MG CAPS Take 1 capsule by mouth 3 (three) times daily.    [provider]  traZODone  (DESYREL ) 50 MG tablet Take  75 mg by mouth at bedtime.     [provider]  Upadacitinib ER 30 MG TB24 Take 30 mg by mouth every morning. 07/25/23 07/24/24  [provider]    Allergies: Cefpodoxime proxetil, Ciprofloxacin, Lactose intolerance (gi), and Motrin  [ibuprofen ]    Review of Systems  All other systems reviewed and are negative.   Updated Vital Signs BP 110/79 (BP Location: Right Arm)   Pulse 80   Temp 99.6 F (37.6 C) (Oral)   Ht 5' (1.524 m)   Wt 86 kg   SpO2 100%   BMI 37.03 kg/m   Physical Exam Vitals and nursing note reviewed.  Constitutional:      General: She is not in acute distress.    Appearance: She is well-developed. She is not diaphoretic.  HENT:     Head: Normocephalic and atraumatic.  Cardiovascular:     Rate and Rhythm: Normal rate and regular rhythm.     Heart sounds: No murmur heard.    No friction rub. No gallop.  Pulmonary:     Effort: Pulmonary effort is normal. No respiratory distress.     Breath sounds: Normal breath sounds. No wheezing.  Abdominal:     General: Bowel sounds are normal. There is no distension.     Palpations: Abdomen is soft.     Tenderness: There is abdominal tenderness. There is no guarding or rebound.     Comments: There is tenderness across the lower abdomen.  Musculoskeletal:        General: Normal range of motion.     Cervical back: Normal range of motion and neck supple.  Skin:    General: Skin is warm and dry.  Neurological:     General: No focal deficit present.     Mental Status: She is alert and oriented to person, place, and time.     (all labs ordered are listed, but only abnormal results are displayed) Labs Reviewed  COMPREHENSIVE METABOLIC PANEL WITH GFR - Abnormal; Notable for the following components:      Result Value   CO2 21 (*)    All other components within normal limits  CBC WITH DIFFERENTIAL/PLATELET  HCG, SERUM, QUALITATIVE  URINALYSIS, ROUTINE W REFLEX MICROSCOPIC     EKG: None  Radiology: No results found.  {Document cardiac monitor, telemetry assessment procedure when appropriate:32947} Procedures   Medications Ordered in the ED  ondansetron  (ZOFRAN ) injection 4 mg (has no administration in time range)  ketorolac  (TORADOL ) 30 MG/ML injection 30 mg (has no administration in time range)  HYDROmorphone  (DILAUDID ) injection 1 mg (has no administration in time range)      {Click here for ABCD2, HEART and other calculators REFRESH Note before signing:1}  Medical Decision Making Risk Prescription drug management.   ***  {Document critical care time when appropriate  Document review of labs and clinical decision tools ie CHADS2VASC2, etc  Document your independent review of radiology images and any outside records  Document your discussion with family members, caretakers and with consultants  Document social determinants of health affecting pt's care  Document your decision making why or why not admission, treatments were needed:32947:::1}   Final diagnoses:  None    ED Discharge Orders     None        "

## 2024-02-05 NOTE — Discharge Instructions (Signed)
 Continue home medications as previously prescribed.  Follow-up with your GYN in the next few days.
